# Patient Record
Sex: Male | Born: 2000 | Race: White | Hispanic: No | Marital: Single | Smoking: Never smoker
Health system: Southern US, Community
[De-identification: ages and names within clinical notes are randomized; demographics above are authoritative.]

## PROBLEM LIST (undated history)

## (undated) DIAGNOSIS — A0472 Enterocolitis due to Clostridium difficile, not specified as recurrent: Secondary | ICD-10-CM

## (undated) DIAGNOSIS — U071 COVID-19: Secondary | ICD-10-CM

## (undated) DIAGNOSIS — K5190 Ulcerative colitis, unspecified, without complications: Secondary | ICD-10-CM

## (undated) DIAGNOSIS — E669 Obesity, unspecified: Secondary | ICD-10-CM

## (undated) DIAGNOSIS — K219 Gastro-esophageal reflux disease without esophagitis: Secondary | ICD-10-CM

## (undated) DIAGNOSIS — R7303 Prediabetes: Secondary | ICD-10-CM

## (undated) DIAGNOSIS — J45909 Unspecified asthma, uncomplicated: Secondary | ICD-10-CM

## (undated) DIAGNOSIS — D802 Selective deficiency of immunoglobulin A [IgA]: Secondary | ICD-10-CM

## (undated) HISTORY — DX: COVID-19: U07.1

## (undated) HISTORY — DX: Selective deficiency of immunoglobulin a (iga): D80.2

## (undated) HISTORY — DX: Obesity, unspecified: E66.9

## (undated) HISTORY — DX: Enterocolitis due to Clostridium difficile, not specified as recurrent: A04.72

## (undated) HISTORY — DX: Unspecified asthma, uncomplicated: J45.909

## (undated) HISTORY — DX: Ulcerative colitis, unspecified, without complications: K51.90

## (undated) HISTORY — DX: Prediabetes: R73.03

## (undated) HISTORY — DX: Gastro-esophageal reflux disease without esophagitis: K21.9

---
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
## 2020-03-24 DIAGNOSIS — R350 Frequency of micturition: Secondary | ICD-10-CM | POA: Diagnosis not present

## 2020-03-24 DIAGNOSIS — Z1331 Encounter for screening for depression: Secondary | ICD-10-CM | POA: Diagnosis not present

## 2020-03-24 DIAGNOSIS — R5381 Other malaise: Secondary | ICD-10-CM | POA: Diagnosis not present

## 2020-03-24 DIAGNOSIS — Z0000 Encounter for general adult medical examination without abnormal findings: Secondary | ICD-10-CM | POA: Diagnosis not present

## 2020-03-24 DIAGNOSIS — Z23 Encounter for immunization: Secondary | ICD-10-CM | POA: Diagnosis not present

## 2020-03-24 DIAGNOSIS — Z13220 Encounter for screening for lipoid disorders: Secondary | ICD-10-CM | POA: Diagnosis not present

## 2020-03-28 ENCOUNTER — Encounter: Payer: Self-pay | Admitting: Gastroenterology

## 2020-04-24 DIAGNOSIS — J329 Chronic sinusitis, unspecified: Secondary | ICD-10-CM | POA: Diagnosis not present

## 2020-04-24 DIAGNOSIS — R051 Acute cough: Secondary | ICD-10-CM | POA: Diagnosis not present

## 2020-04-24 DIAGNOSIS — U071 COVID-19: Secondary | ICD-10-CM | POA: Diagnosis not present

## 2020-04-24 DIAGNOSIS — B9689 Other specified bacterial agents as the cause of diseases classified elsewhere: Secondary | ICD-10-CM | POA: Diagnosis not present

## 2020-05-31 ENCOUNTER — Encounter: Payer: Self-pay | Admitting: Gastroenterology

## 2020-05-31 ENCOUNTER — Ambulatory Visit: Payer: PRIVATE HEALTH INSURANCE | Admitting: Gastroenterology

## 2020-05-31 VITALS — BP 112/68 | HR 96 | Ht 74.0 in | Wt 385.2 lb

## 2020-05-31 DIAGNOSIS — G8929 Other chronic pain: Secondary | ICD-10-CM | POA: Diagnosis not present

## 2020-05-31 DIAGNOSIS — R1013 Epigastric pain: Secondary | ICD-10-CM | POA: Diagnosis not present

## 2020-05-31 DIAGNOSIS — R112 Nausea with vomiting, unspecified: Secondary | ICD-10-CM

## 2020-05-31 MED ORDER — PANTOPRAZOLE SODIUM 20 MG PO TBEC: 20.0000 mg | DELAYED_RELEASE_TABLET | Freq: Every day | ORAL | 11 refills | Status: DC

## 2020-05-31 NOTE — Patient Instructions (Addendum)
If you are age 20 or older, your body mass index should be between 23-30. Your Body mass index is 49.46 kg/m. If this is out of the .range listed, please consider follow up with your Primary Care Provider.  If you are age 31 or younger, your body mass index should be between 19-25. Your Body mass index is 49.46 kg/m. If this is out of the aformentioned range listed, please consider follow up with your Primary Care Provider.   You have been scheduled for an endoscopy. Please follow written instructions given to you at your visit today. If you use inhalers (even only as needed), please bring them with you on the day of your procedure.  We have sent the following medications to your pharmacy for you to pick up at your convenience: Protonix 20mg     Please go to Radiology to schedule your Ultrasound on the first floor before you schedule today.   You have been scheduled for an abdominal ultrasound at [facility] (1st floor [address] ) on -- at -- . Please arrive 15 minutes prior to your appointment for registration. Make certain not to have anything to eat or drink 6 hours prior to your appointment. Should you need to reschedule your appointment, please contact radiology at [phone]. This test typically takes about 30 minutes to perform.   Thank you,  Dr. [phone]

## 2020-05-31 NOTE — Progress Notes (Signed)
Chief Complaint:   Referring Provider:  Mauri Brooklyn, MD      ASSESSMENT AND PLAN;   #1. N/V with upper abdo pain. ?etiology. D/d PUD, GERD, gastritis, nonulcer dyspepsia, gastroparesis, musculoskeletal etiology, r/o biliary or pancreatic problems.  #2. IBS-D. R/O other associated causes. Metformin may contribute to diarrhea.  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 Neg celiac screening with IgG based screening. Prev SB Bx neg but had few intraepithelial lymphocytes. Assoc mildly elevated CRP/sed rate. CT neg for IBD.  #4.  Hepatic steatosis with Nl LFTs, obesity, borderline DM on Metformin, asthma.  Plan: -EGD with SB bx -[place], if neg, HIDA with EF -Continue Protonix 20m po qd -If neg, GES to r/o diabetic gastroparesis. -If still with problems, trial of gluten free diet. -Stop all marijuana. -Encouraged to reduce weight. -D/W pt and pt's mother in detail -CT reviewed.   HPI:    [name] a 20y.o. male  With IGA def, obesity, borderline DM on metformin. (NO UC) accompained by mom Has been under care of pediatric gastroenterology (Dr. [facility]  Had had extensive GI work-up as below.  Per mom, he had GI problems ever since he was born.  When he was 2 months old, he had C. Difficile, treated with antibiotics.  Has been a colicky baby who could only tolerate soy based formulas.  Seen by Dr. GVania Reaand underwent EGD/colonoscopy in 2014.  [facility].  Longstanding history of episodic N/V/upper abdo pain-most recently AM today.  Has associated abdominal bloating and heartburn.  Sodas and greasy foods make his symptoms worse.  Zofran does help with nausea.  He has also been on Bentyl 20 mg every 4-6 hours as needed. "Cannot hold the job d/t GI problems"  Has normal growth.  Alternating diarrhea and constipation x since childhood.  More or less diarrhea.  No nocturnal symptoms.  No melena or  hematochezia.  No weight loss.  In fact, he has gained weight.  His stool studies previously have been neg for GI pathogens, calprotectin and fecal elastase.  CT Abdo/pelvis with p.o. and IV contrast was negative for any inflammatory processes.  He also had negative EGD/colonoscopy in 2019.   He does admit that his symptoms are worse when he is under stress.  No history of abuse.  Denies consistently any history of soda intake, chocolates, mints, alcohol, artificial sweeteners or chewing gums.  No nonsteroidals.      Wt Readings from Last 3 Encounters:  05/31/20 (!) 385 lb 4 oz (174.7 kg) (>99 %, Z= 3.80)*   * Growth percentiles are based on CDC (Boys, 2-20 Years) data.     Aunt has celiac   Labs:  -Nov 2021: Normal CBC with Hb 13.5, WBC count 9.4, platelets 343.  MCV 72.8 (low) -Sed rate 33 (Nov 2021) -Normal TSH 2.9 -Nl lipase. -Previous labs indicated mildly elevated C-reactive protein -Negative celiac screen with IgG based testing. -HLA for celiac was DQ 2 (+)-this does suggest an increased risk for celiac over lifetime although this allele is present in 30-40% of normal population and is non-specific -- (this was done to guide need for future screening in the setting of selective IgA deficiency) -Stool studies 09/2017: neg GI pathogens, c diff PCR, calprotectin (<16), elastase (>500)    Past GI work-up and procedures:  EGD 10/21/2017: [facility]: NL. Neg SB bx with rare intraepithelial lymphocytes. Neg gastric Bx  for HP. Neg eso Bx.  Colonoscopy 10/21/2017: Mild mucosal nodularity of sigmoid colon.  Otherwise grossly normal colonoscopy. Neg TI, right colon, left colon, rectosigmoid biopsies  CT AP 10/16/2017 Shotty sub-cm mesenteric lymph nodes, which are nonspecific but may be seen with mesenteric adenitis. Moderate hepatic steatosis. Past Medical History:  Diagnosis Date  . Asthma   . C. difficile colitis   . COVID-19 virus infection   . GERD (gastroesophageal reflux  disease)   . IgA deficiency (HCC)    cant fight off bacteria   . Prediabetes   . UC (ulcerative colitis) (HCC)    as a child Dr Alphonzo Grieve found ulcers in his stomach     Past Surgical History:  Procedure Laterality Date  . COLONOSCOPY     [facility]   . ESOPHAGOGASTRODUODENOSCOPY     around 2013 [facility] [address] Alphonzo Grieve    Family History  Problem Relation Age of Onset  . Ankylosing spondylitis Mother   . Von Willebrand disease Mother   . Irritable bowel syndrome Father   . Breast cancer Paternal Grandmother   . Heart disease Paternal Grandmother   . Heart disease Paternal Grandfather   . Diabetes Paternal Grandfather   . Celiac disease Maternal Aunt   . Ankylosing spondylitis Maternal Aunt   . Colon cancer Neg Hx   . Esophageal cancer Neg Hx   . Colon polyps Neg Hx     Social History   Tobacco Use  . Smoking status: Never Smoker  . Smokeless tobacco: Never Used  Vaping Use  . Vaping Use: Some days  . Devices: THC  Substance Use Topics  . Alcohol use: Never  . Drug use: Yes    Types: Marijuana    Current Outpatient Medications  Medication Sig Dispense Refill  . albuterol (VENTOLIN HFA) 108 (90 Base) MCG/ACT inhaler Inhale 1-2 puffs into the lungs as needed.    . metFORMIN (GLUCOPHAGE) 850 MG tablet Take 1 tablet by mouth at bedtime.    . Vitamin D, Ergocalciferol, (DRISDOL) 1.25 MG (50000 UNIT) CAPS capsule Take 50,000 Units by mouth once a week.     No current facility-administered medications for this visit.    Not on File  Review of Systems:  Constitutional: Denies fever, chills, diaphoresis, appetite change and fatigue.  HEENT: Denies photophobia, eye pain, redness, hearing loss, ear pain, congestion, sore throat, rhinorrhea, sneezing, mouth sores, neck pain, neck stiffness and tinnitus.   Respiratory: Denies SOB, DOE, cough, chest tightness,  and wheezing.   Cardiovascular: Denies chest pain, palpitations and leg swelling.  Genitourinary: Denies dysuria,  urgency, frequency, hematuria, flank pain and difficulty urinating.  Musculoskeletal: Denies myalgias, back pain, joint swelling, arthralgias and gait problem.  Skin: No rash.  Neurological: Denies dizziness, seizures, syncope, weakness, light-headedness, numbness and headaches.  Hematological: Denies adenopathy. Easy bruising, personal or family bleeding history  Psychiatric/Behavioral: No anxiety or depression     Physical Exam:    BP 112/68   Pulse 96   Ht 6\' 2"  (1.88 m)   Wt (!) 385 lb 4 oz (174.7 kg)   BMI 49.46 kg/m  Wt Readings from Last 3 Encounters:  05/31/20 (!) 385 lb 4 oz (174.7 kg) (>99 %, Z= 3.80)*   * Growth percentiles are based on CDC (Boys, 2-20 Years) data.   Constitutional:  Well-developed, in no acute distress. Psychiatric: Normal mood and affect. Behavior is normal. HEENT: Pupils normal.  Conjunctivae are normal. No scleral icterus. Neck supple.  Cardiovascular: Normal rate, regular rhythm.  No edema Pulmonary/chest: Effort normal and breath sounds normal. No wheezing, rales or rhonchi. Abdominal: Soft, nondistended.  Mild upper abdominal tenderness.. Bowel sounds active throughout. There are no masses palpable. No hepatomegaly. Rectal: Deferred Neurological: Alert and oriented to person place and time. Skin: Skin is warm and dry. No rashes noted.  Data Reviewed: I have personally reviewed following labs and imaging studies    Carmell Austria, MD 05/31/2020, 1:32 PM  Cc: Mauri Brooklyn, MD

## 2020-06-05 ENCOUNTER — Encounter: Payer: Self-pay | Admitting: Gastroenterology

## 2020-06-05 ENCOUNTER — Ambulatory Visit: Payer: BLUE CROSS/BLUE SHIELD | Admitting: Gastroenterology

## 2020-06-12 ENCOUNTER — Ambulatory Visit (HOSPITAL_BASED_OUTPATIENT_CLINIC_OR_DEPARTMENT_OTHER)
Admission: RE | Admit: 2020-06-12 | Discharge: 2020-06-12 | Disposition: A | Discharge status: Home / Self Care | Payer: PRIVATE HEALTH INSURANCE | Source: Ambulatory Visit | Attending: Gastroenterology | Admitting: Gastroenterology

## 2020-06-12 ENCOUNTER — Other Ambulatory Visit: Payer: Self-pay

## 2020-06-12 DIAGNOSIS — R112 Nausea with vomiting, unspecified: Secondary | ICD-10-CM | POA: Diagnosis present

## 2020-06-13 ENCOUNTER — Other Ambulatory Visit: Payer: Self-pay | Admitting: Gastroenterology

## 2020-06-13 ENCOUNTER — Ambulatory Visit (HOSPITAL_BASED_OUTPATIENT_CLINIC_OR_DEPARTMENT_OTHER): Payer: PRIVATE HEALTH INSURANCE

## 2020-06-14 LAB — SARS CORONAVIRUS 2 (TAT 6-24 HRS): SARS Coronavirus 2: NEGATIVE

## 2020-06-15 ENCOUNTER — Encounter: Payer: PRIVATE HEALTH INSURANCE | Admitting: Gastroenterology

## 2023-05-20 ENCOUNTER — Ambulatory Visit (INDEPENDENT_AMBULATORY_CARE_PROVIDER_SITE_OTHER): Payer: PRIVATE HEALTH INSURANCE

## 2023-05-20 VITALS — BP 116/70 | HR 88 | Temp 97.4°F | Ht 74.0 in | Wt 330.0 lb

## 2023-05-20 DIAGNOSIS — Z13220 Encounter for screening for lipoid disorders: Secondary | ICD-10-CM | POA: Diagnosis not present

## 2023-05-20 DIAGNOSIS — E66813 Obesity, class 3: Secondary | ICD-10-CM | POA: Diagnosis not present

## 2023-05-20 DIAGNOSIS — Z87898 Personal history of other specified conditions: Secondary | ICD-10-CM

## 2023-05-20 DIAGNOSIS — F649 Gender identity disorder, unspecified: Secondary | ICD-10-CM

## 2023-05-20 DIAGNOSIS — Z0000 Encounter for general adult medical examination without abnormal findings: Secondary | ICD-10-CM

## 2023-05-20 DIAGNOSIS — D802 Selective deficiency of immunoglobulin A [IgA]: Secondary | ICD-10-CM | POA: Diagnosis not present

## 2023-05-20 DIAGNOSIS — Z6841 Body Mass Index (BMI) 40.0 and over, adult: Secondary | ICD-10-CM

## 2023-05-20 DIAGNOSIS — Z23 Encounter for immunization: Secondary | ICD-10-CM

## 2023-05-20 NOTE — Patient Instructions (Signed)
  You had a routine health maintenance visit today. We discussed your significant weight loss, your history of prediabetes, and your current health status. We also talked about your gender-affirming care, mental health, and family medical history. You received tetanus and flu shots, and we ordered blood work to assess your current health.  YOUR PLAN:  -GENERAL HEALTH MAINTENANCE: This visit was for routine health maintenance. You have lost 150 pounds over the past year through diet and exercise. You received tetanus and flu shots today. We recommend getting eye and dental exams soon. We also ordered blood work to check your overall health and will follow up in six months.  -PREDIABETES: Prediabetes means your blood sugar levels are higher than normal but not high enough to be classified as diabetes. You were previously treated with metformin. Given your significant weight loss, we will check your blood work to see if you still have prediabetes.  -FALL RISK: You have reported frequent falls, including a significant fall that resulted in a twisted ankle eight months ago. We discussed strategies to prevent falls in the future.  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 You are seeking gender-affirming care and are currently seeing a therapist. We discussed hormone replacement therapy and provided resources, including a potential referral to [facility].  -MENTAL HEALTH: You are currently seeing a therapist for support related to gender dysphoria and past trauma. You report no current depression but occasional low mood. Continue with your therapy sessions.  -IGA DEFICIENCY: IgA deficiency is a condition where your body does not produce enough Immunoglobulin A, an antibody that helps fight infections. You have not had any significant infections recently. We will monitor for any signs of infection  and review your past medical records.  -C. DIFFICILE COLITIS: C. difficile colitis is an infection of the colon caused by the bacteria Clostridium difficile. You had this condition in 2019, and it was resolved with treatment. We will monitor your bowel habits and review your past medical records.  -ASTHMA: Asthma is a condition where your airways narrow and swell, making it difficult to breathe. You have not had any recent symptoms or need for an inhaler since childhood. We will continue to monitor for any respiratory symptoms.  -FAMILY HISTORY OF ANKYLOSING SPONDYLITIS AND VON WILLEBRAND DISEASE: You have a family history of ankylosing spondylitis and von Willebrand disease on your mother's side. Ankylosing spondylitis is a type of arthritis that affects the spine, and von Willebrand disease is a bleeding disorder. We will monitor for any symptoms related to these conditions.  INSTRUCTIONS:  Please schedule an eye and dental exam soon. We will follow up in six months to review your blood work results and overall health. If you experience any new symptoms or have any concerns before then, please contact our office.

## 2023-05-20 NOTE — Assessment & Plan Note (Signed)
 Prediabetes diagnosed in high school, previously treated with metformin. Significant weight loss of 150 pounds over the past year. No recent blood work. Patient believes they may no longer be prediabetic. - Order blood work to assess current status Not been on metformin for several years

## 2023-05-20 NOTE — Assessment & Plan Note (Signed)
 Identifies as nonbinary and is seeking gender-affirming care. Currently seeing a therapist for support. Discussed potential hormone replacement therapy and resources available through [facility]. - Provide support and resources for gender-affirming care - Discuss potential referral to [facility] for hormone replacement therapy

## 2023-05-20 NOTE — Progress Notes (Signed)
 Subjective:  Patient ID: Jeffrey Choi, adult    DOB: [birth year]  Age: 23 y.o. MRN: [id number]  Chief Complaint  Patient presents with   Establish Care    Patient is establishing as a new patient. PATIENT PREFERS PRONOUN: THEY/THEM  HPI  Patient is here today to establish care. The patient, previously seen by Dr. Clarene during childhood, presents after a long period without medical care. They have a history of Clostridium difficile colitis in 2019, which was treated and resolved. They also have a known IgA deficiency, which they report has not significantly impacted their health in recent years. The patient denies any current respiratory issues, despite a childhood history of asthma, and has not had an asthma attack since early adolescence.  The patient has a significant weight loss history, losing 150 pounds within the past year through diet modification and increased physical activity. They report a previous weight of 480 pounds and a current goal weight of 200-220 pounds. They were previously on metformin for prediabetes but have not had recent blood work since 2019.  The patient reports regular marijuana use, typically daily after work. 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 They also report occasional alcohol consumption.  The patient has a family history of ankylosing spondylitis and von Willebrand disease in their mother, and IBS in their father. They have not had recent eye or dental exams and report a need for dental care.  The patient is currently seeking gender-affirming care and identifies as nonbinary. They are in therapy and have supportive family members. They are considering hormone replacement therapy but have not made a decision regarding gender transformation surgery. They report no current relationship and no depressive symptoms. They have experienced multiple falls in the past year, with one significant fall resulting in a  twisted ankle approximately eight months ago.    05/20/2023    8:05 AM  Depression screen PHQ 2/9  Decreased Interest 0  Down, Depressed, Hopeless 0  PHQ - 2 Score 0         05/20/2023    8:05 AM  Fall Risk  Falls in the past year? 1  Was there an injury with Fall? 0  Fall Risk Category Calculator 2  Patient at Risk for Falls Due to History of fall(s)     No current outpatient medications on file prior to visit.   No current facility-administered medications on file prior to visit.  . Social History   Socioeconomic History   Marital status: Single    Spouse name: Not on file   Number of children: Not on file   Years of education: Not on file   Highest education level: Not on file  Occupational History   Not on file  Tobacco Use   Smoking status: Never   Smokeless tobacco: Never  Vaping Use   Vaping status: Some Days   Devices: THC  Substance and Sexual Activity   Alcohol use: Yes    Comment: socially   Drug use: Yes    Types: Marijuana   Sexual activity: Not Currently  Other Topics Concern   Not on file  Social History Narrative   Not on file   Social Drivers of Health   Financial Resource Strain: Low Risk  (05/20/2023)   Overall Financial Resource Strain (CARDIA)    Difficulty of Paying Living Expenses: Not hard at all  Food Insecurity: No Food Insecurity (05/20/2023)   Hunger Vital Sign  Worried About Programme Researcher, Broadcasting/film/video in the Last Year: Never true    Ran Out of Food in the Last Year: Never true  Transportation Needs: No Transportation Needs (05/20/2023)   PRAPARE - Administrator, Civil Service (Medical): No    Lack of Transportation (Non-Medical): No  Physical Activity: Sufficiently Active (05/20/2023)   Exercise Vital Sign    Days of Exercise per Week: 5 days    Minutes of Exercise per Session: 90 min  Stress: No Stress Concern Present (05/20/2023)   [employer] of Occupational Health - Occupational Stress Questionnaire     Feeling of Stress : Not at all  Social Connections: Socially Isolated (05/20/2023)   Social Connection and Isolation Panel [NHANES]    Frequency of Communication with Friends and Family: More than three times a week    Frequency of Social Gatherings with Friends and Family: Once a week    Attends Religious Services: Never    Database Administrator or Organizations: No    Attends Engineer, Structural: Never    Marital Status: Never married   Past Medical History:  Diagnosis Date   Asthma    C. difficile colitis    COVID-19 virus infection    GERD (gastroesophageal reflux disease)    IgA deficiency (HCC)    cant fight off bacteria    Obesity    Prediabetes    UC (ulcerative colitis) (HCC)    as a child Dr Mabel found ulcers in his stomach    Family History  Problem Relation Age of Onset   Ankylosing spondylitis Mother    Von Willebrand disease Mother    Irritable bowel syndrome Father    Breast cancer Paternal Grandmother    Heart disease Paternal Grandmother    Heart disease Paternal Grandfather    Diabetes Paternal Grandfather    Celiac disease Maternal Aunt    Ankylosing spondylitis Maternal Aunt    Colon cancer Neg Hx    Esophageal cancer Neg Hx    Colon polyps Neg Hx     Review of Systems  Constitutional:  Negative for chills, fatigue, fever and unexpected weight change.  HENT:  Negative for congestion, rhinorrhea, sinus pressure, sneezing and sore throat.   Eyes:  Negative for discharge and visual disturbance.  Respiratory:  Negative for cough, shortness of breath and wheezing.   Cardiovascular:  Negative for chest pain and palpitations.  Gastrointestinal:  Negative for abdominal pain, diarrhea, nausea and vomiting.  Endocrine: Negative for polydipsia, polyphagia and polyuria.  Genitourinary:  Negative for decreased urine volume, difficulty urinating, dysuria, frequency, penile swelling and urgency.  Musculoskeletal:  Negative for back pain, gait problem,  joint swelling, neck pain and neck stiffness.  Neurological:  Negative for dizziness, seizures, weakness, numbness and headaches.  Psychiatric/Behavioral:  Negative for confusion, hallucinations, sleep disturbance and suicidal ideas. The patient is not nervous/anxious and is not hyperactive.      Objective:  BP 116/70   Pulse 88   Temp (!) 97.4 F (36.3 C)   Ht 6' 2 (1.88 m)   Wt (!) 330 lb (149.7 kg)   SpO2 97%   BMI 42.37 kg/m      05/20/2023    8:02 AM 05/31/2020    1:11 PM  BP/Weight  Systolic BP 116 112  Diastolic BP 70 68  Wt. (Lbs) 330 385.25  BMI 42.37 kg/m2 49.46 kg/m2    Physical Exam Vitals and nursing note reviewed.  Constitutional:  Appearance: Jeffrey Choi is obese.     Comments: Strong body odor noted  HENT:     Head: Normocephalic and atraumatic.  Cardiovascular:     Rate and Rhythm: Normal rate and regular rhythm.  Pulmonary:     Effort: Pulmonary effort is normal.     Breath sounds: Normal breath sounds.  Musculoskeletal:        General: Normal range of motion.  Neurological:     General: No focal deficit present.     Mental Status: Jeffrey Choi is alert.  Psychiatric:        Mood and Affect: Mood normal.    Diabetic Foot Exam - Simple   No data filed      No results found for: WBC, HGB, HCT, PLT, GLUCOSE, CHOL, TRIG, HDL, LDLDIRECT, LDLCALC, ALT, AST, NA, K, CL, CREATININE, BUN, CO2, TSH, PSA, INR, GLUF, HGBA1C, MICROALBUR    Assessment & Plan:  History of prediabetes Prediabetes diagnosed in high school, previously treated with metformin. Significant weight loss of 150 pounds over the past year. No recent blood work. Patient believes they may no longer be prediabetic. - Order blood work to assess current status Not been on metformin for several years  Healthcare maintenance General Health Maintenance Routine health maintenance visit. Significant weight loss of 150 pounds over the past year  through diet and exercise. No current medications or allergies. Daily marijuana use, occasional alcohol use, no smoking. No recent blood work since 2019. - Administer tetanus and flu shots - Order blood work - Recommend eye and dental exams - Schedule follow-up in six months   C. difficile colitis in 2019, resolved with treatment. No current symptoms. - Monitor bowel habits - Review past medical records  Asthma Asthma with no recent symptoms or need for an inhaler. Last asthma attack occurred in childhood. - Monitor for respiratory symptoms  Family History of Ankylosing Spondylitis and von Willebrand Disease Family history of ankylosing spondylitis and von Willebrand disease (mother). No current symptoms reported. - Monitor for symptoms related to family history  Follow-up - Schedule follow-up in six months - Review blood work results and follow up as needed.  Gender dysphoria Identifies as nonbinary and is seeking gender-affirming care. Currently seeing a therapist for support. Discussed potential hormone replacement therapy and resources available through [facility]. - Provide support and resources for gender-affirming care - Discuss potential referral to [facility] for hormone replacement therapy  IgA deficiency (HCC) IgA deficiency, which impairs the ability to fight off bacterial infections. No recent significant infections reported. Normal frequency of illness. - Monitor for signs of infection - Review past medical records    No orders of the defined types were placed in this encounter.  Orders Placed This Encounter  Procedures   Flu vaccine trivalent PF, 6mos and older(Flulaval,Afluria,Fluarix,Fluzone)   Tdap vaccine greater than or equal to 7yo IM   Comprehensive metabolic panel   CBC with Differential/Platelet   Lipid panel   Hemoglobin A1c      Follow-up: Return in about 6 months (around 11/17/2023) for wellness exam.  AVS was given to patient  prior to departure. Total time spent on today's visit was 46 minutes, including both face-to-face time and nonface-to-face time personally spent on review of chart (labs and imaging), discussing labs and goals, discussing further work-up, treatment options, referrals to specialist if needed, reviewing outside records of pertinent, answering patient's questions, and coordinating care.   Tommy Schimke [facility] [phone]

## 2023-05-20 NOTE — Assessment & Plan Note (Signed)
 IgA deficiency, which impairs the ability to fight off bacterial infections. No recent significant infections reported. Normal frequency of illness. - Monitor for signs of infection - Review past medical records

## 2023-05-20 NOTE — Assessment & Plan Note (Addendum)
 General Health Maintenance Routine health maintenance visit. Significant weight loss of 150 pounds over the past year through diet and exercise. No current medications or allergies. Daily marijuana use, occasional alcohol use, no smoking. No recent blood work since 2019. - Administer tetanus and flu shots - Order blood work - Recommend eye and dental exams - Schedule follow-up in six months   C. difficile colitis in 2019, resolved with treatment. No current symptoms. - Monitor bowel habits - Review past medical records  Asthma Asthma with no recent symptoms or need for an inhaler. Last asthma attack occurred in childhood. - Monitor for respiratory symptoms  Family History of Ankylosing Spondylitis and von Willebrand Disease Family history of ankylosing spondylitis and von Willebrand disease (mother). No current symptoms reported. - Monitor for symptoms related to family history  Follow-up - Schedule follow-up in six months - Review blood work results and follow up as needed.

## 2023-05-21 LAB — COMPREHENSIVE METABOLIC PANEL
ALT: 19 [IU]/L (ref 0–44)
AST: 22 [IU]/L (ref 0–40)
Albumin: 4.5 g/dL (ref 4.3–5.2)
Alkaline Phosphatase: 66 [IU]/L (ref 44–121)
BUN/Creatinine Ratio: 13 (ref 9–20)
BUN: 12 mg/dL (ref 6–20)
Bilirubin Total: 1 mg/dL (ref 0.0–1.2)
CO2: 23 mmol/L (ref 20–29)
Calcium: 10 mg/dL (ref 8.7–10.2)
Chloride: 105 mmol/L (ref 96–106)
Creatinine, Ser: 0.96 mg/dL (ref 0.76–1.27)
Globulin, Total: 2.7 g/dL (ref 1.5–4.5)
Glucose: 91 mg/dL (ref 70–99)
Potassium: 4.8 mmol/L (ref 3.5–5.2)
Sodium: 144 mmol/L (ref 134–144)
Total Protein: 7.2 g/dL (ref 6.0–8.5)
eGFR: 115 mL/min/{1.73_m2} (ref >59)

## 2023-05-21 LAB — CBC WITH DIFFERENTIAL/PLATELET
Basophils Absolute: 0 10*3/uL (ref 0.0–0.2)
Basos: 1 %
EOS (ABSOLUTE): 0.2 10*3/uL (ref 0.0–0.4)
Eos: 3 %
Hematocrit: 46 % (ref 37.5–51.0)
Hemoglobin: 14.2 g/dL (ref 13.0–17.7)
Immature Grans (Abs): 0 10*3/uL (ref 0.0–0.1)
Immature Granulocytes: 0 %
Lymphocytes Absolute: 2.1 10*3/uL (ref 0.7–3.1)
Lymphs: 34 %
MCH: 25.1 pg — ABNORMAL LOW (ref 26.6–33.0)
MCHC: 30.9 g/dL — ABNORMAL LOW (ref 31.5–35.7)
MCV: 81 fL (ref 79–97)
Monocytes Absolute: 0.6 10*3/uL (ref 0.1–0.9)
Monocytes: 10 %
Neutrophils Absolute: 3.2 10*3/uL (ref 1.4–7.0)
Neutrophils: 52 %
Platelets: 262 10*3/uL (ref 150–450)
RBC: 5.66 x10E6/uL (ref 4.14–5.80)
RDW: 13.2 % (ref 11.6–15.4)
WBC: 6.1 10*3/uL (ref 3.4–10.8)

## 2023-05-21 LAB — LIPID PANEL
Chol/HDL Ratio: 2.8 {ratio} (ref 0.0–5.0)
Cholesterol, Total: 133 mg/dL (ref 100–199)
HDL: 48 mg/dL (ref >39)
LDL Chol Calc (NIH): 73 mg/dL (ref 0–99)
Triglycerides: 58 mg/dL (ref 0–149)
VLDL Cholesterol Cal: 12 mg/dL (ref 5–40)

## 2023-05-21 LAB — HEMOGLOBIN A1C
Est. average glucose Bld gHb Est-mCnc: 108 mg/dL
Hgb A1c MFr Bld: 5.4 % (ref 4.8–5.6)
